# Patient Record
Sex: Male | Born: 1950 | Hispanic: No | Marital: Single | Smoking: Never smoker
Health system: Southern US, Community
[De-identification: ages and names within clinical notes are randomized; demographics above are authoritative.]

## PROBLEM LIST (undated history)

## (undated) DIAGNOSIS — M25562 Pain in left knee: Secondary | ICD-10-CM

## (undated) DIAGNOSIS — S8990XA Unspecified injury of unspecified lower leg, initial encounter: Secondary | ICD-10-CM

## (undated) HISTORY — PX: BRAIN SURGERY: SHX531

---

## 2010-04-28 ENCOUNTER — Emergency Department (HOSPITAL_COMMUNITY): Admission: EM | Admit: 2010-04-28 | Discharge: 2010-04-28 | Payer: Self-pay | Admitting: Emergency Medicine

## 2012-06-30 ENCOUNTER — Emergency Department (HOSPITAL_COMMUNITY): Payer: Self-pay

## 2012-06-30 ENCOUNTER — Emergency Department (HOSPITAL_COMMUNITY)
Admission: EM | Admit: 2012-06-30 | Discharge: 2012-07-01 | Disposition: A | Discharge status: Home / Self Care | Payer: Self-pay | Attending: Emergency Medicine | Admitting: Emergency Medicine

## 2012-06-30 ENCOUNTER — Encounter (HOSPITAL_COMMUNITY): Payer: Self-pay | Admitting: Emergency Medicine

## 2012-06-30 DIAGNOSIS — G9389 Other specified disorders of brain: Secondary | ICD-10-CM | POA: Insufficient documentation

## 2012-06-30 DIAGNOSIS — R1013 Epigastric pain: Secondary | ICD-10-CM | POA: Insufficient documentation

## 2012-06-30 DIAGNOSIS — R109 Unspecified abdominal pain: Secondary | ICD-10-CM

## 2012-06-30 DIAGNOSIS — R42 Dizziness and giddiness: Secondary | ICD-10-CM | POA: Insufficient documentation

## 2012-06-30 DIAGNOSIS — R110 Nausea: Secondary | ICD-10-CM | POA: Insufficient documentation

## 2012-06-30 LAB — CBC WITH DIFFERENTIAL/PLATELET
Basophils Absolute: 0 10*3/uL (ref 0.0–0.1)
Basophils Relative: 1 % (ref 0–1)
Eosinophils Absolute: 0.2 10*3/uL (ref 0.0–0.7)
Eosinophils Relative: 3 % (ref 0–5)
HCT: 41.1 % (ref 39.0–52.0)
Lymphocytes Relative: 33 % (ref 12–46)
MCHC: 33.8 g/dL (ref 30.0–36.0)
MCV: 89 fL (ref 78.0–100.0)
Monocytes Absolute: 0.5 10*3/uL (ref 0.1–1.0)
Platelets: 236 10*3/uL (ref 150–400)
RDW: 13 % (ref 11.5–15.5)
WBC: 5.3 10*3/uL (ref 4.0–10.5)

## 2012-06-30 LAB — COMPREHENSIVE METABOLIC PANEL
ALT: 26 U/L (ref 0–53)
AST: 32 U/L (ref 0–37)
Albumin: 4 g/dL (ref 3.5–5.2)
CO2: 25 mEq/L (ref 19–32)
Calcium: 9.4 mg/dL (ref 8.4–10.5)
Creatinine, Ser: 0.86 mg/dL (ref 0.50–1.35)
GFR calc non Af Amer: >90 mL/min (ref >90)
Sodium: 137 mEq/L (ref 135–145)
Total Protein: 7.5 g/dL (ref 6.0–8.3)

## 2012-06-30 NOTE — ED Notes (Signed)
Patient transported to MRI 

## 2012-06-30 NOTE — ED Notes (Addendum)
Per interpreter phone: Pt complains of headache, stomachache, and nausea x 1 day.  Reports pain as 8/10 Stomach is non tender on palpation in all four quadrants. Pt. Denies any GI history. Denies changes to bowel and bladder.  Pt. A.O. X 4 . Vitals stable. Family at bedside.

## 2012-06-30 NOTE — ED Provider Notes (Signed)
History     CSN: [id number]  Arrival date & time 06/30/12  2005   First MD Initiated Contact with Patient 06/30/12 2051      Chief Complaint  Patient presents with  . Abdominal Pain  . Dizziness    (Consider location/radiation/quality/duration/timing/severity/associated sxs/prior treatment) Patient is a 61 y.o. male presenting with neurologic complaint. The history is provided by the patient. The history is limited by a language barrier.  Neurologic Problem The primary symptoms include headaches, dizziness and nausea. Primary symptoms do not include loss of consciousness, altered mental status, seizures, visual change, paresthesias, focal weakness, loss of sensation, speech change, fever or vomiting. The symptoms began yesterday. The symptoms are waxing and waning.  The headache began yesterday. The headache developed gradually. Headache is a recurrent problem. The headache is present intermittently. Location/region(s) of the headache: frontal. The headache is not associated with photophobia, eye pain, visual change, neck stiffness, paresthesias, weakness or loss of balance.  He describes the dizziness as a sensation of spinning. The dizziness began yesterday. The dizziness has been resolved since its onset. It is a new problem. Dizziness also occurs with nausea. Dizziness does not occur with tinnitus, vomiting or weakness.  Nausea began yesterday. The nausea is exacerbated by motion.  Additional symptoms include vertigo. Additional symptoms do not include neck stiffness, weakness, loss of balance, photophobia, nystagmus, hearing loss or tinnitus. Medical issues do not include cerebral vascular accident. Associated medical issues comments: History of head trauma and craniotomy 10 years ago.Marland Kitchen    History reviewed. No pertinent past medical history.  Past Surgical History  Procedure Date  . Brain surgery     History reviewed. No pertinent family history.  History  Substance Use Topics    . Smoking status: Never Smoker   . Smokeless tobacco: Not on file  . Alcohol Use: No      Review of Systems  Constitutional: Negative for fever.  HENT: Negative for hearing loss, neck stiffness and tinnitus.   Eyes: Negative for photophobia, pain and visual disturbance.  Respiratory: Negative.   Cardiovascular: Negative.   Gastrointestinal: Positive for nausea and abdominal pain (epigastric). Negative for vomiting, diarrhea and blood in stool.  Genitourinary: Negative.   Musculoskeletal: Negative.   Skin: Negative.   Neurological: Positive for dizziness, vertigo and headaches. Negative for tremors, speech change, focal weakness, seizures, loss of consciousness, syncope, facial asymmetry, speech difficulty, weakness, numbness, paresthesias and loss of balance.  Psychiatric/Behavioral: Negative for altered mental status.  All other systems reviewed and are negative.    Allergies  Review of patient's allergies indicates no known allergies.  Home Medications   Current Outpatient Rx  Name Route Sig Dispense Refill  . IBUPROFEN 200 MG PO TABS Oral Take 200 mg by mouth every 6 (six) hours as needed. For pain.      BP 107/63  Pulse 54  Temp 97.6 F (36.4 C) (Oral)  Resp 16  SpO2 100%  Physical Exam  Nursing note and vitals reviewed. Constitutional: He is oriented to person, place, and time. He appears well-developed and well-nourished. No distress.  HENT:  Head: Normocephalic and atraumatic.  Right Ear: Tympanic membrane and ear canal normal.  Left Ear: Tympanic membrane and ear canal normal.  Nose: Nose normal.  Mouth/Throat: Oropharynx is clear and moist and mucous membranes are normal. No oropharyngeal exudate.  Eyes: Conjunctivae and EOM are normal. Pupils are equal, round, and reactive to light.  Fundoscopic exam:      The right eye shows no  papilledema.       The left eye shows no papilledema.  Neck: Neck supple. No spinous process tenderness and no muscular  tenderness present.  Cardiovascular: Normal rate, regular rhythm, normal heart sounds and intact distal pulses.   Pulmonary/Chest: Effort normal and breath sounds normal. He has no wheezes. He has no rales.  Abdominal: Soft. He exhibits no distension. There is no tenderness.  Musculoskeletal: Normal range of motion.  Neurological: He is alert and oriented to person, place, and time. He has normal strength. No cranial nerve deficit or sensory deficit. Coordination and gait normal. GCS eye subscore is 4. GCS verbal subscore is 5. GCS motor subscore is 6.  Skin: Skin is warm and dry.    ED Course  Procedures (including critical care time)  Labs Reviewed  COMPREHENSIVE METABOLIC PANEL - Abnormal; Notable for the following:    Glucose, Bld 101 (*)     All other components within normal limits  URINALYSIS, ROUTINE W REFLEX MICROSCOPIC - Abnormal; Notable for the following:    APPearance CLOUDY (*)     All other components within normal limits  CBC WITH DIFFERENTIAL  LIPASE, BLOOD   Dg Skull 1-3 Views  06/30/2012  *RADIOLOGY REPORT*  Clinical Data: Dizziness.  Pre MRI evaluation.  Unknown surgical history.  SKULL - 1-3 VIEW  Comparison: None  Findings: AP and lateral views are performed of the skull, showing patient with had prior craniotomy.  Multiple small metallic fragments project over the right aspect of the skull, likely representing surgical sutures/metal related to craniotomy.  No definite coils or clips overlying the central aspect of the brain.  IMPRESSION: Retained metal fragments, favored to be peripherally located on the right.  MRI will be deferred in favor of CT exam of the brain.  Original Report Authenticated By: Patterson Hammersmith, M.D.   Ct Head Wo Contrast  07/01/2012  *RADIOLOGY REPORT*  Clinical Data: Abdominal pain and dizziness.  Bifrontal headache with vertigo, nausea.  History of craniotomy.  Skull views show a small metallic fragments, primarily overlying the right lateral  skull.  CT HEAD WITHOUT CONTRAST  Technique:  Contiguous axial images were obtained from the base of the skull through the vertex without contrast.  Comparison: Skull films 06/30/2012  Findings: There is extensive encephalomalacia and hydrocephalus ex vacuo in the right middle cerebral artery distribution, consistent with large, chronic infarct.  There is no evidence for intracranial hemorrhage.  No intra or extra-axial mass identified.  No evidence for acute infarction.  There is cerebral and cerebellar atrophy. No evidence for aneurysm clips or coils.  Bone windows show craniotomy changes in the right frontal and temporal regions.  No evidence for acute calvarial fracture. Small metallic densities are identified adjacent to the skull, consistent with prior surgical change/craniotomy.  IMPRESSION:  1.  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 2. No evidence for acute intracranial abnormality. 3.  Postoperative changes account for the metallic densities seen on plain film.  No contraindication to MRI.  Original Report Authenticated By: Patterson Hammersmith, M.D.     1. Vertigo 2. Headache    MDM  61 yo male with PMHx of head trauma and craniotomy 10 years ago presents with complaint of intermittent vertigo, headache, and nausea that started yesterday.  Sx are waxing and waning.  Pt sx free in the ED.  No focal weakness, numbness, or paresthesias.  No history of fever.  AF, VSS,  NAD.  Normal neuro exam including sensation, strength, and cerebellar testing.  Mild epigastric tenderness on exam.  CBC and CMP wnl.  Will add lipase and get MRI for further evaluation.  Unable to obtain MRI without further evaluation of surgical clips.  Will get plain films of the skull and CT head w/o contrast.  Lipase wnl.  UA negative for UTI.  CT showed extensive old right MCA infarct with hydrocephalus ex vacuo and  encephalomalacia.  No acute intracranial abnormalities.  No contraindication to MRI.  Will hold in CDU overnight for MRI in the morning.      Cherre Robins, MD 07/01/12 [phone]

## 2012-06-30 NOTE — ED Notes (Signed)
Pt unable to urinate at this time. States will try later 

## 2012-06-30 NOTE — ED Notes (Signed)
Pt speaks spanish, family at bedside translating; pt having dizziness, headache, mid abd pain; with nausea and vomiting, had similar symptoms 2 weeks ago, but has resolved, until they started again last night

## 2012-07-01 ENCOUNTER — Emergency Department (HOSPITAL_COMMUNITY): Payer: Self-pay

## 2012-07-01 ENCOUNTER — Encounter (HOSPITAL_COMMUNITY): Payer: Self-pay | Admitting: Radiology

## 2012-07-01 LAB — URINALYSIS, ROUTINE W REFLEX MICROSCOPIC
Glucose, UA: NEGATIVE mg/dL
Hgb urine dipstick: NEGATIVE
Leukocytes, UA: NEGATIVE
Protein, ur: NEGATIVE mg/dL
Specific Gravity, Urine: 1.014 (ref 1.005–1.030)

## 2012-07-01 NOTE — ED Provider Notes (Signed)
61 year old male had onset yesterday of a bifrontal headache with associated vertigo and nausea. There's been no fever or chills. There's been no nausea or vomiting. His exam is unremarkable. There is no nystagmus and dizziness is not reproduced by vestibular stimulation. Neurologic exam is normal including normal including normal cerebellar exam. MRI was requested but could not be done safely because he apparently has a metallic plate in his head. CT has been ordered.  Dione Booze, MD 07/01/12 [phone]

## 2012-07-01 NOTE — ED Provider Notes (Signed)
61 y/o male in CDU awaiting MRI. Family in room translating for patient. Denies any new s/s. Resting comfortably and in NAD. Heart RRR. Lungs CTA with normal respiratory effort. No extremity edema. Distal pulses intact. No motor or sensory deficits present. Patient is AAOx3.   10:54 AM MRI showing no acute findings. Patient states he is feeling better. Denies any dizziness at this time. Neuro exam with no focal neuro deficits. CN II-XII intact. No motor or sensory deficits present. PERRLA. EOMI. Normal gait. AAOx3. Case discussed with Dr. Lorenso Courier who agrees patient may be discharged at this time ith neuro follow up.  Trevor Mace, PA-C 07/01/12 1057

## 2012-07-01 NOTE — ED Notes (Signed)
Pt. Updated on plan of care: aware that he will need to stay until later this morning to have an MRI. Will be roomed to CDU when possible.  

## 2012-07-01 NOTE — ED Provider Notes (Signed)
I saw and evaluated the patient, reviewed the resident's note and I agree with the findings and plan.   Darriel Utter, MD 07/01/12 2255 

## 2012-07-01 NOTE — ED Notes (Addendum)
Pt. Updated on plan of care: aware that he will need to stay until later this morning to have an MRI. Will be roomed to CDU when possible.

## 2012-07-01 NOTE — ED Provider Notes (Signed)
Patient had no new complaints overnight.  He will have MRI this AM, and be reassessed.  Case signed out to Dr. Lorenso Courier, and PA Zella Ball.  Gerhard Munch, MD 07/01/12 [phone]

## 2012-07-01 NOTE — ED Notes (Signed)
MD at bedside. 

## 2012-07-01 NOTE — ED Notes (Signed)
Family at bedside. 

## 2012-07-01 NOTE — ED Notes (Signed)
Unable to move Pt to CDU at this time due to nurse: Pt ratio. EDP aware.

## 2012-07-01 NOTE — ED Provider Notes (Signed)
History     CSN: [id number]  Arrival date & time 06/30/12  2005   First MD Initiated Contact with Patient 06/30/12 2051      Chief Complaint  Patient presents with  . Abdominal Pain  . Dizziness    (Consider location/radiation/quality/duration/timing/severity/associated sxs/prior treatment) HPI  History reviewed. No pertinent past medical history.  Past Surgical History  Procedure Date  . Brain surgery     History reviewed. No pertinent family history.  History  Substance Use Topics  . Smoking status: Never Smoker   . Smokeless tobacco: Not on file  . Alcohol Use: No      Review of Systems  Allergies  Review of patient's allergies indicates no known allergies.  Home Medications   Current Outpatient Rx  Name Route Sig Dispense Refill  . IBUPROFEN 200 MG PO TABS Oral Take 200 mg by mouth every 6 (six) hours as needed. For pain.      BP 128/67  Pulse 50  Temp 97.9 F (36.6 C) (Oral)  Resp 16  SpO2 99%  Physical Exam  ED Course  Procedures (including critical care time)  Labs Reviewed  COMPREHENSIVE METABOLIC PANEL - Abnormal; Notable for the following:    Glucose, Bld 101 (*)     All other components within normal limits  URINALYSIS, ROUTINE W REFLEX MICROSCOPIC - Abnormal; Notable for the following:    APPearance CLOUDY (*)     All other components within normal limits  CBC WITH DIFFERENTIAL  LIPASE, BLOOD   Dg Skull 1-3 Views  06/30/2012  *RADIOLOGY REPORT*  Clinical Data: Dizziness.  Pre MRI evaluation.  Unknown surgical history.  SKULL - 1-3 VIEW  Comparison: None  Findings: AP and lateral views are performed of the skull, showing patient with had prior craniotomy.  Multiple small metallic fragments project over the right aspect of the skull, likely representing surgical sutures/metal related to craniotomy.  No definite coils or clips overlying the central aspect of the brain.  IMPRESSION: Retained metal fragments, favored to be peripherally  located on the right.  MRI will be deferred in favor of CT exam of the brain.  Original Report Authenticated By: Patterson Hammersmith, M.D.   Ct Head Wo Contrast  07/01/2012  *RADIOLOGY REPORT*  Clinical Data: Abdominal pain and dizziness.  Bifrontal headache with vertigo, nausea.  History of craniotomy.  Skull views show a small metallic fragments, primarily overlying the right lateral skull.  CT HEAD WITHOUT CONTRAST  Technique:  Contiguous axial images were obtained from the base of the skull through the vertex without contrast.  Comparison: Skull films 06/30/2012  Findings: There is extensive encephalomalacia and hydrocephalus ex vacuo in the right middle cerebral artery distribution, consistent with large, chronic infarct.  There is no evidence for intracranial hemorrhage.  No intra or extra-axial mass identified.  No evidence for acute infarction.  There is cerebral and cerebellar atrophy. No evidence for aneurysm clips or coils.  Bone windows show craniotomy changes in the right frontal and temporal regions.  No evidence for acute calvarial fracture. Small metallic densities are identified adjacent to the skull, consistent with prior surgical change/craniotomy.  IMPRESSION:  1.  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 2. No evidence for acute intracranial abnormality. 3.  Postoperative changes account for the metallic densities seen on plain film.  No contraindication to MRI.  Original Report Authenticated By: Patterson Hammersmith, M.D.   Mr Brain Wo Contrast  07/01/2012  *RADIOLOGY REPORT*  Clinical Data: Frontal headache and new onset of dizziness yesterday.  MRI HEAD WITHOUT CONTRAST  Technique:  Multiplanar, multiecho pulse sequences of the brain and surrounding structures were obtained according to standard protocol without intravenous contrast.  Comparison: CT head without contrast 07/01/2012.  Findings:  Extensive medial encephalomalacia is present within the right frontal and occipital lobe.  There is asymmetric atrophy of the entire hemisphere with ex vacuo dilation of the right lateral ventricle.  The patient is status post right frontal craniotomy. There is some metallic artifact secondary to metal external to the craniotomy flap.  No acute hemorrhage is present.  There is some susceptibility in the left cerebellum which may be related to remote blood products.  A tiny lacunar infarct is present in this area.  No acute infarct, hemorrhage, or mass lesion is present.  The left cerebral hemisphere is relatively normal for age.  Flow is present in the major intracranial arteries.  The globes and orbits are intact.  Mild mucosal thickening is present in the maxillary sinuses bilaterally.  A small polyp or mucous retention cyst is evident in the posterior left maxillary sinus.  The mastoid air cells are clear.  IMPRESSION:  1.  Chronic encephalomalacia along the medial aspect the right cerebral hemisphere involving both the ACA and PCA territories. 2.  Asymmetric atrophy of the right hemisphere with associated ex vacuo dilation. 3.  Status post right craniotomy. 4.  Mild mucosal disease as described.  No evidence for acute sinusitis. 3.  Remote lacunar infarct of the left cerebellum with minimal susceptibility, suggesting remote petechial hemorrhage.  No acute hemorrhages present.  Original Report Authenticated By: Jamesetta Orleans. MATTERN, M.D.     No diagnosis found.    MDM  Received sign-over at 0730, pt was awaiting an MRI of brain.  The MRI demonstrates no acute evidence of infarction.  Plan repeat neuro exam, if no new neurologic deficits are observed, will discharge home.        Tobin Chad, MD 07/01/12 1049

## 2012-07-01 NOTE — ED Notes (Signed)
Pt. Awake and updated on plan of care. Aware that he will be going for MRI during the next shift. A.O. X 4. NAD.

## 2012-07-01 NOTE — ED Notes (Signed)
Pt. Returned from CT.

## 2013-03-28 ENCOUNTER — Emergency Department (HOSPITAL_COMMUNITY)
Admission: EM | Admit: 2013-03-28 | Discharge: 2013-03-28 | Disposition: A | Discharge status: Home / Self Care | Payer: Self-pay | Attending: Emergency Medicine | Admitting: Emergency Medicine

## 2013-03-28 ENCOUNTER — Encounter (HOSPITAL_COMMUNITY): Payer: Self-pay | Admitting: Emergency Medicine

## 2013-03-28 ENCOUNTER — Emergency Department (HOSPITAL_COMMUNITY): Payer: Self-pay

## 2013-03-28 DIAGNOSIS — G8929 Other chronic pain: Secondary | ICD-10-CM | POA: Insufficient documentation

## 2013-03-28 DIAGNOSIS — M25561 Pain in right knee: Secondary | ICD-10-CM

## 2013-03-28 DIAGNOSIS — M25569 Pain in unspecified knee: Secondary | ICD-10-CM | POA: Insufficient documentation

## 2013-03-28 HISTORY — DX: Pain in left knee: M25.562

## 2013-03-28 MED ORDER — HYDROCODONE-ACETAMINOPHEN 5-325 MG PO TABS
1.0000 | ORAL_TABLET | Freq: Once | ORAL | Status: AC
  Administered 2013-03-28: 1 via ORAL
  Filled 2013-03-28: qty 1

## 2013-03-28 MED ORDER — HYDROCODONE-ACETAMINOPHEN 5-325 MG PO TABS: 1.0000 | ORAL_TABLET | Freq: Once | ORAL | Status: DC

## 2013-03-28 NOTE — ED Notes (Signed)
Pt in the bathroom at this time.  

## 2013-03-28 NOTE — ED Notes (Signed)
Pt has returned from the bathroom

## 2013-03-28 NOTE — ED Notes (Signed)
Pt c/o left knee pain x 8 years that is worse over last couple of days; pt noted to have limp per norm per family; pt speaks lilttle english

## 2013-03-30 NOTE — ED Provider Notes (Signed)
History     CSN: [id number]  Arrival date & time 03/28/13  1428   First MD Initiated Contact with Patient 03/28/13 1727      Chief Complaint  Patient presents with  . Knee Pain    (Consider location/radiation/quality/duration/timing/severity/associated sxs/prior treatment) Patient is a 61 y.o. male presenting with knee pain. The history is provided by the patient. A language interpreter was used.  Knee Pain Location:  Knee Knee location:  L knee Pain details:    Quality:  Aching Associated symptoms: no fever   Associated symptoms comment:  He has had chronic pain for many years and is requesting a surgery referral. His son, who is giving history, states that he goes to a PCP but that the problem is not being solved.   Past Medical History  Diagnosis Date  . Left knee pain     chronic    Past Surgical History  Procedure Laterality Date  . Brain surgery      History reviewed. No pertinent family history.  History  Substance Use Topics  . Smoking status: Never Smoker   . Smokeless tobacco: Not on file  . Alcohol Use: No      Review of Systems  Constitutional: Negative for fever and chills.  HENT: Negative.   Respiratory: Negative.   Cardiovascular: Negative.   Gastrointestinal: Negative.   Musculoskeletal: Negative.        See HPI  Skin: Negative.   Neurological: Negative.     Allergies  Review of patient's allergies indicates no known allergies.  Home Medications   Current Outpatient Rx  Name  Route  Sig  Dispense  Refill  . ibuprofen (ADVIL,MOTRIN) 200 MG tablet   Oral   Take 200 mg by mouth every 6 (six) hours as needed. For pain.         Marland Kitchen HYDROcodone-acetaminophen (NORCO/VICODIN) 5-325 MG per tablet   Oral   Take 1 tablet by mouth once.   15 tablet   0     BP 113/72  Pulse 69  Temp(Src) 98.4 F (36.9 C) (Oral)  Resp 16  SpO2 96%  Physical Exam  Constitutional: He is oriented to person, place, and time. He appears well-developed  and well-nourished. No distress.  Cardiovascular: Intact distal pulses.   Pulmonary/Chest: Effort normal.  Musculoskeletal:  Right knee nontender to palpation. He appears uncomfortable when walking but can mbulate without assistance. Joint appears stable without laxity. No swelling or redness.   Neurological: He is alert and oriented to person, place, and time. He has normal reflexes.    ED Course  Procedures (including critical care time)  Labs Reviewed - No data to display Dg Femur Left  03/28/2013  *RADIOLOGY REPORT*  Clinical Data: Knee pain  LEFT FEMUR - 2 VIEW  Comparison: None.  Findings: Five views of the left femur submitted.  No acute fracture or subluxation.  Mild narrowing of medial joint compartment.  Narrowing of patellofemoral joint space.  Small knee joint effusion.  IMPRESSION: No acute fracture or subluxation.  Degenerative changes knee joint. Small joint effusion.   Original Report Authenticated By: Natasha Mead, M.D.      1. Knee pain, right       MDM  Through interpreter, patient told we would make orthopedic referral, given something for pain and encourage follow up. Patient's son reports all concerns of patient and family have been addressed.        Arnoldo Hooker, PA-C 03/30/13 1346

## 2013-03-31 NOTE — ED Provider Notes (Signed)
  Medical screening examination/treatment/procedure(s) were performed by non-physician practitioner and as supervising physician I was immediately available for consultation/collaboration.    Vida Roller, MD 03/31/13 0800

## 2013-05-17 ENCOUNTER — Encounter (HOSPITAL_COMMUNITY): Payer: Self-pay | Admitting: *Deleted

## 2013-05-17 ENCOUNTER — Emergency Department (HOSPITAL_COMMUNITY)
Admission: EM | Admit: 2013-05-17 | Discharge: 2013-05-17 | Disposition: A | Discharge status: Home / Self Care | Payer: PRIVATE HEALTH INSURANCE | Source: Home / Self Care | Attending: Family Medicine | Admitting: Family Medicine

## 2013-05-17 DIAGNOSIS — M25562 Pain in left knee: Secondary | ICD-10-CM

## 2013-05-17 DIAGNOSIS — M25569 Pain in unspecified knee: Secondary | ICD-10-CM

## 2013-05-17 DIAGNOSIS — G8929 Other chronic pain: Secondary | ICD-10-CM

## 2013-05-17 MED ORDER — CAPSAICIN IN LIDOCAINE VEHICLE 0.25 % EX CREA: 1.0000 "application " | TOPICAL_CREAM | Freq: Two times a day (BID) | CUTANEOUS | Status: DC | PRN

## 2013-05-17 MED ORDER — ACETAMINOPHEN 650 MG PO TABS: 1.0000 | ORAL_TABLET | Freq: Three times a day (TID) | ORAL | Status: DC | PRN

## 2013-05-17 MED ORDER — TRAMADOL HCL 50 MG PO TABS: 50.0000 mg | ORAL_TABLET | Freq: Three times a day (TID) | ORAL | Status: DC | PRN

## 2013-05-17 MED ORDER — NAPROXEN 500 MG PO TABS: 500.0000 mg | ORAL_TABLET | Freq: Two times a day (BID) | ORAL | Status: DC

## 2013-05-17 NOTE — ED Provider Notes (Signed)
History     CSN: [id number]  Arrival date & time 05/17/13  1108   First MD Initiated Contact with Patient 05/17/13 1143      Chief Complaint  Patient presents with  . Knee Pain    (Consider location/radiation/quality/duration/timing/severity/associated sxs/prior treatment) HPI Comments: 62 year old male with history of chronic left knee pain. 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 Although denies history of low extremity fractures. Here c/o pain exacerbation in his left knee. No recent or new injuries or falls. He works picking up aluminum from the streets and walks for prolonged periods during the day. Patient has been seen at [facility] clinic for this complain was given a knee brace which he is using. Currently not taking any medications for pain.   Past Medical History  Diagnosis Date  . Left knee pain     chronic    Past Surgical History  Procedure Laterality Date  . Brain surgery      History reviewed. No pertinent family history.  History  Substance Use Topics  . Smoking status: Never Smoker   . Smokeless tobacco: Not on file  . Alcohol Use: No      Review of Systems  Constitutional: Negative for fever, chills and appetite change.  Musculoskeletal:       Left knee pain as per HPI  All other systems reviewed and are negative.    Allergies  Review of patient's allergies indicates no known allergies.  Home Medications   Current Outpatient Rx  Name  Route  Sig  Dispense  Refill  . Acetaminophen 650 MG TABS   Oral   Take 1 tablet (650 mg total) by mouth 3 (three) times daily as needed.   30 tablet   0   . Capsaicin in Lidocaine Vehicle 0.25 % CREA   Apply externally   Apply 1 application topically 2 (two) times daily as needed.   1 Tube   0   . naproxen (NAPROSYN) 500 MG tablet   Oral   Take 1 tablet (500 mg total) by  mouth 2 (two) times daily with a meal.   30 tablet   0   . traMADol (ULTRAM) 50 MG tablet   Oral   Take 1 tablet (50 mg total) by mouth every 8 (eight) hours as needed for pain.   20 tablet   0     BP 108/67  Pulse 58  Temp(Src) 98.4 F (36.9 C) (Oral)  Resp 20  SpO2 100%  Physical Exam  Nursing note and vitals reviewed. Constitutional: He is oriented to person, place, and time. He appears well-developed and well-nourished. No distress.  Cardiovascular: Normal heart sounds.   Pulmonary/Chest: Breath sounds normal.  Musculoskeletal:  Left knee: genu varus bilaterally. No obvious swelling or deformity. No redness or increased temperature. No palpable effusion. Reported pain with palpation below and medial to the patella. No hyper laxity on stress valgo or varus. Crepitus fell with flexion and extension and also patient reported pain with active and passive movement. Negative drawer test. Do not feel Baker's cyst. leftt lower extremity with normal superficial sensation Normal dorsal pedal and tibial posterior pulses. Patient is weightbearing but report pain with walking and has a limp on the left side.  Neurological: He is alert and oriented to person, place, and time.  Skin: He is not diaphoretic.    ED Course  Procedures (  including critical care time)  Labs Reviewed - No data to display No results found.   1. Chronic knee pain, left       MDM   Treated with acetaminophen, naproxen, tramadol and capsaicin. Recommended to followup with his primary care provider to monitor his symptoms and for possible orthopedic referral. Supportive care including knee sleeve and rehabilitation exercises and red flags that should prompt his return to medical attention discussed with patient and provided in writing.       Sharin Grave, MD 05/18/13 [phone]

## 2013-05-17 NOTE — ED Notes (Signed)
Pt  Reports  l  Knee  Pain         X  7  Days           denys  Any  Recent  specefic  Injury          He  Was  Seen  Er  Last  Month  For  Similar  Symptoms  -  He  Also  Reports  Pain and  Stiffness  In  His  Fingers  Of  r  Hand

## 2016-08-27 ENCOUNTER — Emergency Department (HOSPITAL_COMMUNITY): Payer: Self-pay

## 2016-08-27 ENCOUNTER — Encounter (HOSPITAL_COMMUNITY): Payer: Self-pay | Admitting: Emergency Medicine

## 2016-08-27 ENCOUNTER — Emergency Department (HOSPITAL_COMMUNITY)
Admission: EM | Admit: 2016-08-27 | Discharge: 2016-08-27 | Disposition: A | Discharge status: Home / Self Care | Payer: Self-pay | Attending: Emergency Medicine | Admitting: Emergency Medicine

## 2016-08-27 ENCOUNTER — Emergency Department (HOSPITAL_BASED_OUTPATIENT_CLINIC_OR_DEPARTMENT_OTHER): Admit: 2016-08-27 | Discharge: 2016-08-27 | Disposition: A | Discharge status: Home / Self Care | Payer: Self-pay

## 2016-08-27 DIAGNOSIS — Y999 Unspecified external cause status: Secondary | ICD-10-CM | POA: Insufficient documentation

## 2016-08-27 DIAGNOSIS — M79609 Pain in unspecified limb: Secondary | ICD-10-CM

## 2016-08-27 DIAGNOSIS — M79605 Pain in left leg: Secondary | ICD-10-CM | POA: Insufficient documentation

## 2016-08-27 DIAGNOSIS — W19XXXA Unspecified fall, initial encounter: Secondary | ICD-10-CM

## 2016-08-27 DIAGNOSIS — M62838 Other muscle spasm: Secondary | ICD-10-CM

## 2016-08-27 DIAGNOSIS — W1839XA Other fall on same level, initial encounter: Secondary | ICD-10-CM | POA: Insufficient documentation

## 2016-08-27 DIAGNOSIS — Y929 Unspecified place or not applicable: Secondary | ICD-10-CM | POA: Insufficient documentation

## 2016-08-27 DIAGNOSIS — Y939 Activity, unspecified: Secondary | ICD-10-CM | POA: Insufficient documentation

## 2016-08-27 HISTORY — DX: Unspecified injury of unspecified lower leg, initial encounter: S89.90XA

## 2016-08-27 LAB — BASIC METABOLIC PANEL
ANION GAP: 9 (ref 5–15)
BUN: 8 mg/dL (ref 6–20)
CO2: 29 mmol/L (ref 22–32)
Calcium: 9.1 mg/dL (ref 8.9–10.3)
Chloride: 100 mmol/L — ABNORMAL LOW (ref 101–111)
Creatinine, Ser: 0.77 mg/dL (ref 0.61–1.24)
GFR calc Af Amer: >60 mL/min (ref >60)
GLUCOSE: 93 mg/dL (ref 65–99)
POTASSIUM: 3.6 mmol/L (ref 3.5–5.1)
Sodium: 138 mmol/L (ref 135–145)

## 2016-08-27 LAB — CK: CK TOTAL: 231 U/L (ref 49–397)

## 2016-08-27 LAB — MAGNESIUM: MAGNESIUM: 2.3 mg/dL (ref 1.7–2.4)

## 2016-08-27 LAB — PHOSPHORUS: Phosphorus: 3.8 mg/dL (ref 2.5–4.6)

## 2016-08-27 MED ORDER — OXYCODONE-ACETAMINOPHEN 5-325 MG PO TABS
1.0000 | ORAL_TABLET | ORAL | Status: DC | PRN
  Administered 2016-08-27: 1 via ORAL
  Filled 2016-08-27: qty 1

## 2016-08-27 MED ORDER — CYCLOBENZAPRINE HCL 5 MG PO TABS: 5.0000 mg | ORAL_TABLET | Freq: Three times a day (TID) | ORAL | 0 refills | Status: DC | PRN

## 2016-08-27 MED ORDER — CYCLOBENZAPRINE HCL 10 MG PO TABS
10.0000 mg | ORAL_TABLET | Freq: Once | ORAL | Status: AC
  Administered 2016-08-27: 10 mg via ORAL
  Filled 2016-08-27: qty 1

## 2016-08-27 NOTE — ED Provider Notes (Signed)
[facility]-EMERGENCY DEPT Provider Note  CSN: [id number] Arrival Date & Time: 08/27/16 @ 1122  History    Chief Complaint Chief Complaint  Patient presents with  . Leg Pain    HPI Miguel Rhodes is a 65 y.o. male. Patient presents emergency department for assessment of left lower extremity pain that is localized to the left knee and left hip. Patient also endorses he has had chronic swelling from previous injury to that extremity approximately 3 years ago when he had work related fall that resulted in other significant trauma. Patient states that he fell 8 days ago did not have obvious wound or cut or laceration and denies fevers and chills at this time. Also denies he has had redness or swelling localized to knee joint however endorses that walking has become much more difficult for him and states that he is still able to walk with cane however there is significant amounts of pain.  Patient has no history of previous osteoporosis or gout or pseudogout. States he takes no other medications and has no other medical conditions other than those sustained from traumatic event. Patient denies chest pain or shortness of breath no palpitations.  Past Medical & Surgical History    Past Medical History:  Diagnosis Date  . Left knee pain    chronic  . Leg injury    There are no active problems to display for this patient.  Past Surgical History:  Procedure Laterality Date  . BRAIN SURGERY      Family & Social History    History reviewed. No pertinent family history. Social History  Substance Use Topics  . Smoking status: Never Smoker  . Smokeless tobacco: Never Used  . Alcohol use No    Home Medications    Prior to Admission medications   Medication Sig Start Date End Date Taking? Authorizing Provider  Acetaminophen 650 MG TABS Take 1 tablet (650 mg total) by mouth 3 (three) times daily as needed. 05/17/13   Adlih Moreno-Coll, MD  Capsaicin in Lidocaine Vehicle 0.25 % CREA  Apply 1 application topically 2 (two) times daily as needed. 05/17/13   Adlih Moreno-Coll, MD  naproxen (NAPROSYN) 500 MG tablet Take 1 tablet (500 mg total) by mouth 2 (two) times daily with a meal. 05/17/13   Adlih Moreno-Coll, MD  traMADol (ULTRAM) 50 MG tablet Take 1 tablet (50 mg total) by mouth every 8 (eight) hours as needed for pain. 05/17/13   Adlih Moreno-Coll, MD    Allergies    Review of patient's allergies indicates no known allergies.  I reviewed & agree with nursing's documentation on the patient's past medical, surgical, social & family histories as well as their allergies.  Review of Systems  Complete ROS obtained, and is negative except as stated in HPI.   Physical Exam  Updated Vital Signs BP 126/72 (BP Location: Right Arm)   Pulse 67   Resp 16   Wt 68 kg   SpO2 100%  I have reviewed the triage vital signs and the nursing notes. Physical Exam  Constitutional: He is oriented to person, place, and time. He appears well-developed and well-nourished.  Non-toxic appearance. He does not appear ill. No distress.  HENT:  Head: Normocephalic and atraumatic.  Right Ear: External ear normal.  Left Ear: External ear normal.  Mouth/Throat: Oropharynx is clear and moist.  Eyes: EOM are normal. Pupils are equal, round, and reactive to light. No scleral icterus.  Neck: Normal range of motion. Neck supple. No tracheal deviation  present.  Cardiovascular: Normal heart sounds and intact distal pulses.   No murmur heard. Pulmonary/Chest: Effort normal and breath sounds normal. No stridor. No respiratory distress. He has no wheezes. He has no rales.  Abdominal: Soft. Bowel sounds are normal. He exhibits no distension. There is no tenderness. There is no rebound and no guarding. No hernia (No femoral or inguinal hernias bilaterally.).  Musculoskeletal: Normal range of motion. He exhibits no deformity.  Neurological: He is alert and oriented to person, place, and time. He has normal  strength and normal reflexes. No cranial nerve deficit or sensory deficit.  Skin: Skin is warm and dry. Capillary refill takes less than 2 seconds. He is not diaphoretic.  Psychiatric: He has a normal mood and affect. His behavior is normal.  Nursing note and vitals reviewed.   ED Treatments & Results   Labs (only abnormal results are displayed) Labs Reviewed - No data to display  EKG    EKG Interpretation  Date/Time:    Ventricular Rate:    PR Interval:    QRS Duration:   QT Interval:    QTC Calculation:   R Axis:     Text Interpretation:         Radiology Dg Knee Complete 4 Views Left  Result Date: 08/27/2016 CLINICAL DATA:  Larey Seat 8 days ago with distal femur pain. EXAM: LEFT KNEE - COMPLETE 4+ VIEW COMPARISON:  Left femur 03/28/2013 and 08/27/2016 FINDINGS: Increased medial joint space narrowing compared to the previous examination. Mild depression of the medial tibial plateau appears to be chronic and may be associated with degenerative changes. Evidence for small suprapatellar joint effusion. Osteophytes in the lateral and patellofemoral compartments of the knee. 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 No acute bone abnormality. Electronically Signed   By: Richarda Overlie M.D.   On: 08/27/2016 12:53   Dg Femur Min 2 Views Left  Result Date: 08/27/2016 CLINICAL DATA:  Pain following fall 8 days prior EXAM: LEFT FEMUR 2 VIEWS COMPARISON:  None. FINDINGS: Frontal and lateral views were obtained. There is no fracture or dislocation. There is osteoarthritic change in the knee joint region. No abnormal periosteal reaction. IMPRESSION: Osteoarthritic change left knee region. No fracture or dislocation. No abnormal periosteal reaction. Electronically Signed   By: Bretta Bang III M.D.   On: 08/27/2016 12:51    Pertinent labs & imaging results that were available during my care of the patient were personally visualized by me and considered in my  medical decision making, please see chart for details.  Procedures (including critical care time) Procedures  Medications Ordered in ED Medications  oxyCODONE-acetaminophen (PERCOCET/ROXICET) 5-325 MG per tablet 1 tablet (1 tablet Oral Given 08/27/16 1147)    Initial Impression & Assessment and Plan & ED Course   Patient presents to the emergency department for assessment of acute on chronic leg pain in left lower extremity. Patient worse his original injury was approximately 3 years ago and states that it was reinjured 8 days ago and has had increasing pain since then. Patient is hemodynamically stable and has no vital sign abnormalities and is afebrile. Do not have concern for sepsis or septic arthritis and there is no draining ulcers and have considered osteomyelitis which are believed to be unlikely.  I obtained plain diagnostic imaging of the left upper extremity including hip and femur knee and pelvis along with obtaining metabolic screening labs CK and other electrolytes given patient's endorsement of localization to muscles of affected  extremity. I also obtained venous duplex ultrasound of left lower extremity given patient's chronic poor ambulatory status and repeated traumatic events. Patient has no shortness of breath or chest pain and given stable vital signs do not have concern for pulmonary embolism. Labs unremarkable. Imaging shows No acute fracture or dislocation of the pelvis, left knee or left femur. Also DVT US of lower extremities shows no evidence of deep vein or superficial thrombosis involving the right lower extremity and left lower extremity.  Given patient's significant muscle  fasciculations and endorsement of muscle pain are provided muscle relaxant which patient found near symptomatic relief w/ therefore wil provide short course and outpatient rehab referral.  Final Clinical Impressions(s) & ED Diagnoses   1. Fall     Final Disposition: ED Course in its entirety,  care plan & clinical impressions w/ associated risks were reviewed w/ the patient and relative(s). In light of the patient's reassuring evaluation above, I consider discharge disposition reasonable. They are in agreement. I gave my typical, strict return precautions in simple, non-technical language. We also discussed symptoms that are most concerning & would necessitate emergent return. I explicitly told them to immediately return to the ED if new symptoms develop, if worse, or for ANY concern. Treatments & follow up plan agreed upon & I confirmed all concerns & questions were addressed prior to discharge.  Follow Up [facility] EMERGENCY DEPARTMENT [address] [id number] mc [place] [address] [phone] Go to  FOR ANY CONCERNS OR IF [facility] [place] [address], [address] [place] [address] [phone] Schedule an appointment as soon as possible for a visit in 1 week for rehabilitation and physical therapy in leg  [facility] [address] [place] [address] [phone] Schedule an appointment as soon as possible for a visit in 1 day to establish Primary Care, For symptomatic reassessment   New Prescriptions Discharge Medication List as of 08/27/2016  6:53 PM    START taking these medications   Details  cyclobenzaprine (FLEXERIL) 5 MG tablet Take 1 tablet (5 mg total) by mouth 3 (three) times daily as needed for muscle spasms. Take dose & frequency as prescribed. Don't take with any alcohol. Don't with your previous anxiety, sleeping or pain medications without asking your physician if okay.  Can result in death, impair thinking/coordination/reflexes & cause drowsiness. Don't operate heavy machinery or drive while taking. Is addicting if taken more than needed., Starting Thu 08/27/2016, Print        Patient care discussed with the  attending physician, who oversaw their evaluation & treatment & voiced agreement. Note: This document was prepared using Dragon voice recognition software and may include unintentional dictation errors.  House Officer: Jonette Eva, MD, Emergency Medicine Resident.   Jonette Eva, MD 08/31/16 0000    Jacalyn Lefevre, MD 09/01/16 [phone]

## 2016-08-27 NOTE — Progress Notes (Signed)
**  Preliminary report by tech**  Left lower extremity venous duplex completed. Technically difficult scan due to patient immobility, and acoustic shadowing. There is no evidence of deep or superficial vein thrombosis involving the left lower extremity. All visualized vessels appear patent and compressible. There is no evidence of a Baker's cyst on the left. Results were given to the patient's nurse, Steward DroneBrenda.  08/27/16 5:26 PM Olen CordialGreg Lavere Stork RVT

## 2016-08-27 NOTE — ED Notes (Signed)
Vascular at bedside

## 2016-08-27 NOTE — ED Notes (Signed)
Pt brought in to room with interpreter video utilized ER MD to bedside pt appears uncomfortable from pain ER MD aware orders received

## 2016-08-27 NOTE — ED Triage Notes (Signed)
Patient has left thigh injury years ago. Here for evaluation of increased pain and falls. Last fall 8 days ago. Pain currently 9/10 achy sharp.

## 2018-01-21 ENCOUNTER — Encounter (HOSPITAL_COMMUNITY): Payer: Self-pay | Admitting: Emergency Medicine

## 2018-01-21 ENCOUNTER — Emergency Department (HOSPITAL_COMMUNITY)
Admission: EM | Admit: 2018-01-21 | Discharge: 2018-01-22 | Disposition: A | Discharge status: Home / Self Care | Payer: Self-pay | Attending: Emergency Medicine | Admitting: Emergency Medicine

## 2018-01-21 ENCOUNTER — Other Ambulatory Visit: Payer: Self-pay

## 2018-01-21 ENCOUNTER — Emergency Department (HOSPITAL_COMMUNITY): Payer: Self-pay

## 2018-01-21 DIAGNOSIS — Z79899 Other long term (current) drug therapy: Secondary | ICD-10-CM | POA: Insufficient documentation

## 2018-01-21 DIAGNOSIS — R51 Headache: Secondary | ICD-10-CM | POA: Insufficient documentation

## 2018-01-21 DIAGNOSIS — R0789 Other chest pain: Secondary | ICD-10-CM | POA: Insufficient documentation

## 2018-01-21 DIAGNOSIS — R519 Headache, unspecified: Secondary | ICD-10-CM

## 2018-01-21 DIAGNOSIS — R42 Dizziness and giddiness: Secondary | ICD-10-CM | POA: Insufficient documentation

## 2018-01-21 DIAGNOSIS — R079 Chest pain, unspecified: Secondary | ICD-10-CM

## 2018-01-21 DIAGNOSIS — J690 Pneumonitis due to inhalation of food and vomit: Secondary | ICD-10-CM | POA: Insufficient documentation

## 2018-01-21 DIAGNOSIS — R112 Nausea with vomiting, unspecified: Secondary | ICD-10-CM | POA: Insufficient documentation

## 2018-01-21 LAB — BASIC METABOLIC PANEL
ANION GAP: 12 (ref 5–15)
BUN: 17 mg/dL (ref 6–20)
CALCIUM: 8.8 mg/dL — AB (ref 8.9–10.3)
CO2: 26 mmol/L (ref 22–32)
Chloride: 105 mmol/L (ref 101–111)
Creatinine, Ser: 0.9 mg/dL (ref 0.61–1.24)
GFR calc non Af Amer: >60 mL/min (ref >60)
GLUCOSE: 115 mg/dL — AB (ref 65–99)
POTASSIUM: 4 mmol/L (ref 3.5–5.1)
Sodium: 143 mmol/L (ref 135–145)

## 2018-01-21 LAB — CBC
HEMATOCRIT: 42.9 % (ref 39.0–52.0)
HEMOGLOBIN: 14.6 g/dL (ref 13.0–17.0)
MCH: 31.1 pg (ref 26.0–34.0)
MCHC: 34 g/dL (ref 30.0–36.0)
MCV: 91.5 fL (ref 78.0–100.0)
Platelets: 299 10*3/uL (ref 150–400)
RBC: 4.69 MIL/uL (ref 4.22–5.81)
RDW: 13.5 % (ref 11.5–15.5)
WBC: 9.2 10*3/uL (ref 4.0–10.5)

## 2018-01-21 LAB — I-STAT TROPONIN, ED: TROPONIN I, POC: 0 ng/mL (ref 0.00–0.08)

## 2018-01-21 NOTE — ED Triage Notes (Signed)
Patient complaining of left chest pain, dizziness, vomiting, and a headache. Patient states these symptoms started yesterday.

## 2018-01-22 ENCOUNTER — Emergency Department (HOSPITAL_COMMUNITY): Payer: Self-pay

## 2018-01-22 ENCOUNTER — Encounter (HOSPITAL_COMMUNITY): Payer: Self-pay | Admitting: Radiology

## 2018-01-22 LAB — RAPID URINE DRUG SCREEN, HOSP PERFORMED
Amphetamines: NOT DETECTED
Barbiturates: NOT DETECTED
Benzodiazepines: NOT DETECTED
COCAINE: NOT DETECTED
OPIATES: NOT DETECTED
Tetrahydrocannabinol: NOT DETECTED

## 2018-01-22 LAB — I-STAT CHEM 8, ED
BUN: 15 mg/dL (ref 6–20)
CALCIUM ION: 1.05 mmol/L — AB (ref 1.15–1.40)
CHLORIDE: 105 mmol/L (ref 101–111)
CREATININE: 0.8 mg/dL (ref 0.61–1.24)
GLUCOSE: 107 mg/dL — AB (ref 65–99)
HCT: 44 % (ref 39.0–52.0)
Hemoglobin: 15 g/dL (ref 13.0–17.0)
Potassium: 3.8 mmol/L (ref 3.5–5.1)
Sodium: 143 mmol/L (ref 135–145)
TCO2: 29 mmol/L (ref 22–32)

## 2018-01-22 LAB — I-STAT TROPONIN, ED: TROPONIN I, POC: 0 ng/mL (ref 0.00–0.08)

## 2018-01-22 LAB — URINALYSIS, ROUTINE W REFLEX MICROSCOPIC
Bilirubin Urine: NEGATIVE
Glucose, UA: NEGATIVE mg/dL
Hgb urine dipstick: NEGATIVE
Ketones, ur: NEGATIVE mg/dL
Leukocytes, UA: NEGATIVE
Nitrite: NEGATIVE
PH: 7 (ref 5.0–8.0)
Protein, ur: NEGATIVE mg/dL
SPECIFIC GRAVITY, URINE: 1.015 (ref 1.005–1.030)

## 2018-01-22 LAB — ETHANOL: Alcohol, Ethyl (B): <10 mg/dL (ref <10)

## 2018-01-22 LAB — APTT: aPTT: 37 seconds — ABNORMAL HIGH (ref 24–36)

## 2018-01-22 LAB — PROTIME-INR
INR: 0.94
PROTHROMBIN TIME: 12.5 s (ref 11.4–15.2)

## 2018-01-22 MED ORDER — MECLIZINE HCL 25 MG PO TABS: 25.0000 mg | ORAL_TABLET | Freq: Three times a day (TID) | ORAL | 0 refills | Status: AC | PRN

## 2018-01-22 MED ORDER — MECLIZINE HCL 25 MG PO TABS
25.0000 mg | ORAL_TABLET | Freq: Once | ORAL | Status: AC
  Administered 2018-01-22: 25 mg via ORAL
  Filled 2018-01-22: qty 1

## 2018-01-22 MED ORDER — AMOXICILLIN-POT CLAVULANATE 875-125 MG PO TABS: 1.0000 | ORAL_TABLET | Freq: Two times a day (BID) | ORAL | 0 refills | Status: AC

## 2018-01-22 MED ORDER — ACETAMINOPHEN 500 MG PO TABS
1000.0000 mg | ORAL_TABLET | Freq: Once | ORAL | Status: AC
  Administered 2018-01-22: 1000 mg via ORAL
  Filled 2018-01-22: qty 2

## 2018-01-22 NOTE — ED Notes (Signed)
Pt aware urine sample is needed 

## 2018-01-22 NOTE — ED Provider Notes (Addendum)
Transferred from [facility] ED for MRI and MRA of the head.  [facility], please see her note for full details, but in brief patient presents to the ED for evaluation of headache dizziness and left-sided chest pain.  She has had intermittent episodes of left-sided chest pain.  In 2 days ago developed severe vertiginous symptoms, last night ataxia and dizziness became worse, and patient developed a sudden headache.  Normal neurologic exam in, but unable to assess gait.   Vitals stable thus far in ED course. Labs overall unremarkable, negative trop and EKG reassuring. CXR shows Opacities at the right lung base may indicate developing consolidation, possibly aspiration or pneumonia. No leukocytosis and vitals not indicative of sepsis. 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 MRI and MRA of head ordered and pt transferred for further workup.  Plan: Follow up on MRI results.  Mr Angiogram Head Wo Contrast  Result Date: 01/22/2018 CLINICAL DATA:  Vertigo beginning 5 days ago. EXAM: MRI HEAD WITHOUT CONTRAST MRA HEAD WITHOUT CONTRAST TECHNIQUE: Multiplanar, multiecho pulse sequences of the brain and surrounding structures were obtained without intravenous contrast. Angiographic images of the head were obtained using MRA technique without contrast. COMPARISON:  CT 01/22/2018.  MRI 07/01/2012. FINDINGS: MRI HEAD FINDINGS Brain: Diffusion imaging does not show any acute or subacute infarction. The brainstem is normal except for wall air in degeneration on the right. No cerebellar abnormality. Left cerebral hemisphere shows mild generalized atrophy with a small focus of cortical gliosis in the medial left frontal lobe. Right hemisphere shows changes of chronic atrophy, encephalomalacia and gliosis throughout, with some relative sparing of the right parietal  region. No sign of mass lesion, hemorrhage, obstructive hydrocephalus or extra-axial collection. Ex vacuo enlargement of the right lateral ventricular system. Vascular: Major vessels at the base of the brain show flow. Skull and upper cervical spine: Previous right craniotomy. No acute bone finding. Sinuses/Orbits: Clear/normal Other: None MRA HEAD FINDINGS Both internal carotid arteries are patent through the skull base and siphon regions. The anterior and middle cerebral vessels are presently patent, though there is diminished flow signal within the right ACA and MCA branches secondary to the decreased amount of brain tissue. Both vertebral arteries are widely patent. Small distal fenestration. The basilar artery is patent and normal. Posterior circulation branch vessels are patent, with diminutive right PCA due to old brain loss in that region. IMPRESSION: No acute finding to explain the clinical presentation. Distant right craniotomy. 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 Left hemisphere shows mild atrophy and a small focus of gliosis in the medial left frontal lobe. Negative intracranial MR angiography, with the exception of diminished flow in the right-sided intracranial branch vessels related to the old right hemispheric brain insult. Electronically Signed   By: Paulina FusiMark  Shogry M.D.   On: 01/22/2018 09:33   Mr Brain Wo Contrast  Result Date: 01/22/2018 CLINICAL DATA:  Vertigo beginning 5 days ago. EXAM: MRI HEAD WITHOUT CONTRAST MRA HEAD WITHOUT CONTRAST TECHNIQUE: Multiplanar, multiecho pulse sequences of the brain and surrounding structures were obtained without intravenous contrast. Angiographic images of the head were obtained using MRA technique without contrast. COMPARISON:  CT 01/22/2018.  MRI 07/01/2012. FINDINGS: MRI HEAD FINDINGS Brain: Diffusion imaging does not show any acute or subacute infarction. The brainstem is  normal except for wall  air in degeneration on the right. No cerebellar abnormality. Left cerebral hemisphere shows mild generalized atrophy with a small focus of cortical gliosis in the medial left frontal lobe. Right hemisphere shows changes of chronic atrophy, encephalomalacia and gliosis throughout, with some relative sparing of the right parietal region. No sign of mass lesion, hemorrhage, obstructive hydrocephalus or extra-axial collection. Ex vacuo enlargement of the right lateral ventricular system. Vascular: Major vessels at the base of the brain show flow. Skull and upper cervical spine: Previous right craniotomy. No acute bone finding. Sinuses/Orbits: Clear/normal Other: None MRA HEAD FINDINGS Both internal carotid arteries are patent through the skull base and siphon regions. The anterior and middle cerebral vessels are presently patent, though there is diminished flow signal within the right ACA and MCA branches secondary to the decreased amount of brain tissue. Both vertebral arteries are widely patent. Small distal fenestration. The basilar artery is patent and normal. Posterior circulation branch vessels are patent, with diminutive right PCA due to old brain loss in that region. IMPRESSION: No acute finding to explain the clinical presentation. Distant right craniotomy. 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 Left hemisphere shows mild atrophy and a small focus of gliosis in the medial left frontal lobe. Negative intracranial MR angiography, with the exception of diminished flow in the right-sided intracranial branch vessels related to the old right hemispheric brain insult. Electronically Signed   By: Paulina Fusi M.D.   On: 01/22/2018 09:33   9:44 AM On reevaluation pt reports headache has improved some now 3/10 and dizziness is less severe. CP has completely resolved and pt had reassuring EKG and 2 normal trops. MRI shows  no acute intracranial cause for patients symptoms. Using [place] interpreter services discussed these results with the patient and family. AT baseline pt uses a walker to ge around, will have pt ambulate in department. Tylenol for remaining headache. He reports meclizine seemed to help with dizziness.   CXR does show possible pneumonia versus aspiration and pt did have vomiting initially with dizziness, vomiting has since resolved and he is tolerating PO in ED, will treat for aspiration pneumonia with Augmentin. Pt has had normal O2 saturations, normal work of breathing with ambulation.  Pt has no PCP, will have pt [facility]. Pt wants to go home today, tolerating PO in department. At this time I feel pt is stable for discharge home with outpatient follow up. Strict return precautions discussed with pt and family, they express understanding and are in agreement with plan.  Pt discussed with Dr. Estell Harpin who is in agreement with plan.   Final diagnoses:  Vertigo  Sudden onset of severe headache  Chest pain, unspecified type  Nausea and vomiting, intractability of vomiting not specified, unspecified vomiting type  Aspiration pneumonia of right lower lobe, unspecified aspiration pneumonia type [facility])     ED Discharge Orders        Ordered    meclizine (ANTIVERT) 25 MG tablet  3 times daily PRN     01/22/18 1048    amoxicillin-clavulanate (AUGMENTIN) 875-125 MG tablet  2 times daily     01/22/18 1048           Jodi Geralds [place], [place] 01/22/18 1056    Bethann Berkshire, MD 01/22/18 1329

## 2018-01-22 NOTE — ED Notes (Signed)
wealked patient to the bathroom patient did well

## 2018-01-22 NOTE — Discharge Instructions (Signed)
Your evaluation today has been reassuring, MRI shows evidence of previous older strokes, but does not show any new acute changes to cause your symptoms.  You may use meclizine 3 times a day as needed to help with dizziness.  If this is getting worse, or you are having severe nausea vomiting please return to the ED for reevaluation.  Your chest x-ray did show possible evidence of a an aspiration pneumonia, this likely happened in the setting of his vomiting, please take Augmentin twice daily for the next week to treat this infection, this antibiotic can cause diarrhea, please make sure you are taking with food, you may also use an over-the-counter probiotic to help with diarrhea.   If you develop persistent fevers, chest pain or difficulty breathing, shortness of breath, severe cough, or your dizziness is getting worse, headache becomes more severe, or you develop difficulty with speech, numbness or weakness in your arms or legs, or facial drooping, please return to the ED for reevaluation.

## 2018-01-22 NOTE — ED Provider Notes (Signed)
[facility]-EMERGENCY DEPT Provider Note   CSN: [id number] Arrival date & time: 01/21/18  2018     History   Chief Complaint Chief Complaint  Patient presents with  . Headache  . Chest Pain  . Dizziness    HPI Miguel Rhodes is a 67 y.o. male who presents the emergency department with multiple complaints.  There is a language barrier and I also suspect some mild dementia or confusion at baseline which limits the patient's history significantly.  Translation is provided by his family member as the patient needed significant instruction during physical examination.  Patient's symptoms began 5 days ago.  He began having intermittent left-sided chest pain lasting about 2 minutes at a time.  He describes the pain as "like being punched in the chest."  He noticed his heart racing during these events.  He denies palpitations.  2 days ago he began having vertiginous symptoms.  Nothing seemed to make the vertigo worse or better and it is not changed with position.  He has never had anything like this before.  To visit today his symptoms became significantly worse he develop epigastric abdominal pain associated with nausea and multiple episodes of nonbloody nonbilious vomitus.  At 7 PM the patient states that he became so ataxic and dizzy he was no longer able to get off of the toilet had to be helped by family members into a wheelchair.  Simultaneously he developed a severe headache which came on suddenly that he rated at 8 out of 10.  He denied changes in vision, unilateral weakness, difficulty with speech or swallowing.  Patient currently rates his headache at about a 5 out of 10 is epigastric pain at a 5 out of 10 and does not currently have chest pain or racing heart.  He does not take any medications.  He does not smoke.  He has a history of alcohol use but has not had any alcohol in the past 6 years.  He denies a history of high cholesterol, hypertension or diabetes.   The patient has a history of an injury to his left leg many years ago that interferes with normal ambulation.  He normally uses a 4 pronged cane for ambulatory assistance.  HPI  Past Medical History:  Diagnosis Date  . Left knee pain    chronic  . Leg injury     There are no active problems to display for this patient.   Past Surgical History:  Procedure Laterality Date  . BRAIN SURGERY         Home Medications    Prior to Admission medications   Medication Sig Start Date End Date Taking? Authorizing Provider  Multiple Vitamins-Minerals (MULTIVITAMIN WITH MINERALS) tablet Take 1 tablet by mouth daily.   Yes [provider]    Family History History reviewed. No pertinent family history.  Social History Social History   Tobacco Use  . Smoking status: Never Smoker  . Smokeless tobacco: Never Used  Substance Use Topics  . Alcohol use: No  . Drug use: No     Allergies   Patient has no known allergies.   Review of Systems Review of Systems Ten systems reviewed and are negative for acute change, except as noted in the HPI.    Physical Exam Updated Vital Signs BP 140/77 (BP Location: Left Arm)   Pulse 63   Temp 97.8 F (36.6 C) (Oral)   Resp 14   Ht 5\' 8"  (1.727 m)   Wt  95.3 kg (210 lb)   SpO2 99%   BMI 31.93 kg/m   Physical Exam  Constitutional: He is oriented to person, place, and time. He appears well-developed and well-nourished. No distress.  HENT:  Head: Normocephalic and atraumatic.  Eyes: Conjunctivae and EOM are normal. Pupils are equal, round, and reactive to light. No scleral icterus.  No nystagmus  Neck: Normal range of motion. Neck supple.  Cardiovascular: Normal rate, regular rhythm and normal heart sounds.  Pulmonary/Chest: Effort normal and breath sounds normal. No respiratory distress.  Abdominal: Soft. There is no tenderness.  Musculoskeletal: He exhibits no edema.  Neurological: He is alert and oriented to person,  place, and time.  Speech is clear and goal oriented, follows commands Major Cranial nerves without deficit, no facial droop Normal strength in upper and lower extremities bilaterally including dorsiflexion and plantar flexion, strong and equal grip strength Sensation normal to light and sharp touch Moves extremities without ataxia, coordination intact Normal finger to nose and rapid alternating movements Neg romberg, no pronator drift Unable to assess gait or heel to shin   Skin: Skin is warm and dry. He is not diaphoretic.  Psychiatric: His behavior is normal.  Nursing note and vitals reviewed.    ED Treatments / Results  Labs (all labs ordered are listed, but only abnormal results are displayed) Labs Reviewed  BASIC METABOLIC PANEL - Abnormal; Notable for the following components:      Result Value   Glucose, Bld 115 (*)    Calcium 8.8 (*)    All other components within normal limits  CBC  I-STAT TROPONIN, ED    EKG  EKG Interpretation None       Radiology Dg Chest 2 View  Result Date: 01/21/2018 CLINICAL DATA:  Chest pain EXAM: CHEST  2 VIEW COMPARISON:  None. FINDINGS: Pain opacities at the right lung base. Cardiomediastinal contours are normal. No pleural effusion or pneumothorax. IMPRESSION: Opacities at the right lung base may indicate developing consolidation, possibly aspiration or pneumonia. Electronically Signed   By: Deatra Robinson M.D.   On: 01/21/2018 22:14    Procedures Procedures (including critical care time)  Medications Ordered in ED Medications - No data to display   Initial Impression / Assessment and Plan / ED Course  I have reviewed the triage vital signs and the nursing notes.  Pertinent labs & imaging results that were available during my care of the patient were reviewed by me and considered in my medical decision making (see chart for details).     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  She states she was very young and does not know much and the son who is present states that he had no idea he had ever had a brain surgery.  Even these facts the patient needs further workup with MRI.  I have spoken with Dr. Wilkie Aye and she will accept him at [facility] for an ED to ED transfer.  I have advised the patient and his family of this and he will go over for further workup.  Final Clinical Impressions(s) / ED Diagnoses   Final diagnoses:  Vertigo  Sudden onset of severe headache  Chest pain, unspecified type  Nausea  and vomiting, intractability of vomiting not specified, unspecified vomiting type    ED Discharge Orders    None       Arthor Captain, PA-C 01/22/18 [id number]    Ward, Layla Maw, DO 01/22/18 [phone]

## 2018-01-22 NOTE — ED Notes (Signed)
Patient transported to MRI 

## 2019-06-28 IMAGING — MR MR MRA HEAD W/O CM
9 of 11 series · 29 of 48 positions shown · non-contrast
Comparison: CT 01/22/2018.  MRI 07/01/2012.

CLINICAL DATA: Vertigo beginning 5 days ago.

EXAM:
MRI HEAD WITHOUT CONTRAST
MRA HEAD WITHOUT CONTRAST
TECHNIQUE: Multiplanar, multiecho pulse sequences of the brain and surrounding
structures were obtained without intravenous contrast. Angiographic
images of the head were obtained using MRA technique without
contrast.

[Series 3: DWI · axial · 3.0mm · 0.94mm/px · z∈[-78,+68]mm · 6 of 99 slices shown (1 of 2)]
[im 1/99]
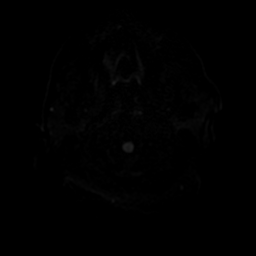
[im 20/99]
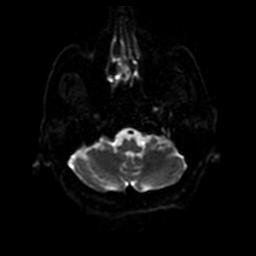
[im 40/99]
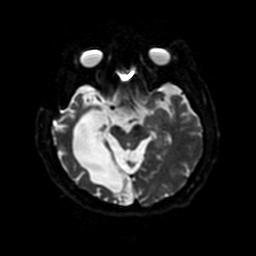
[im 59/99]
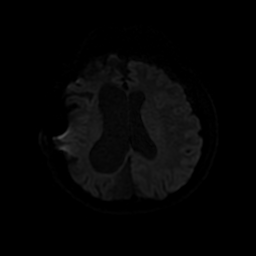
[im 79/99]
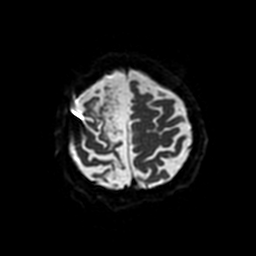
[im 99/99]
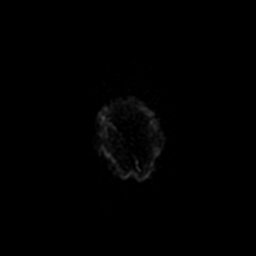

[Series 4: ax (id) 2 · axial · 1.0mm · 0.43mm/px · z∈[-69,-1]mm · 6 of 200 slices shown]
[im 1/200]
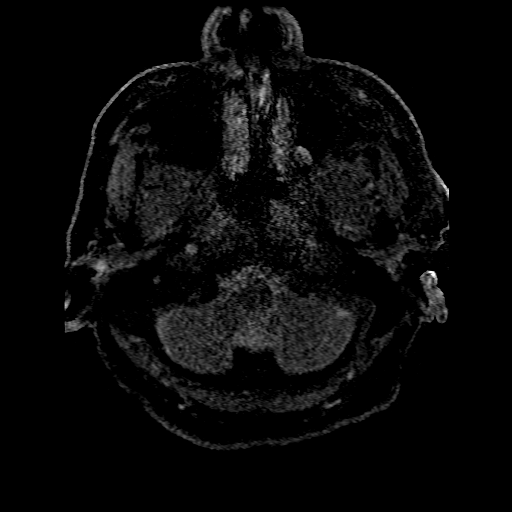
[im 31/200]
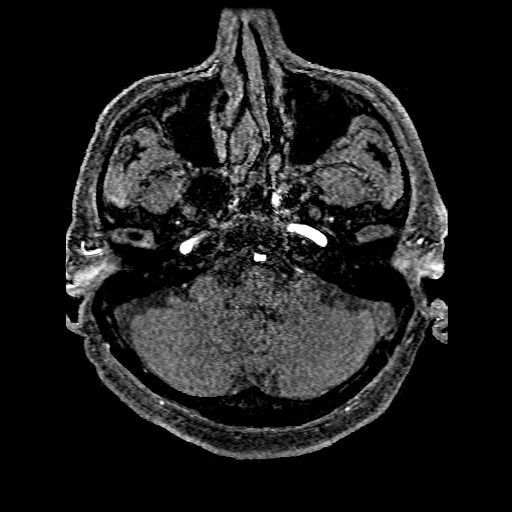
[im 62/200]
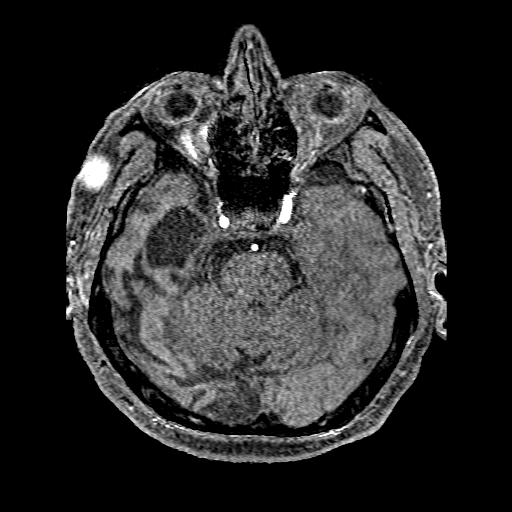
[im 92/200]
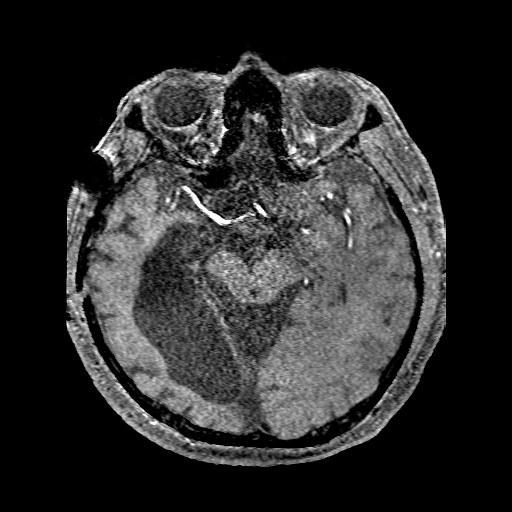
[im 108/200]
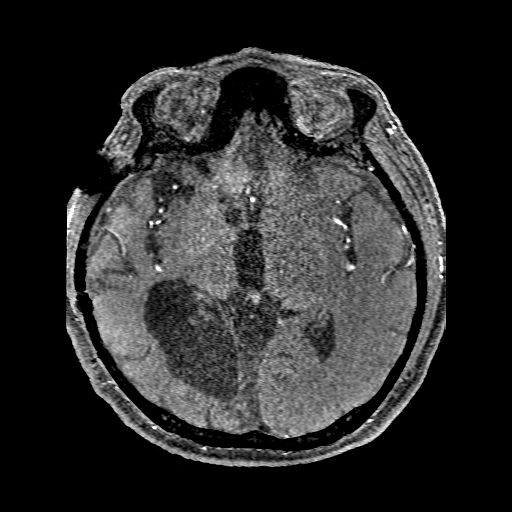
[im 138/200]
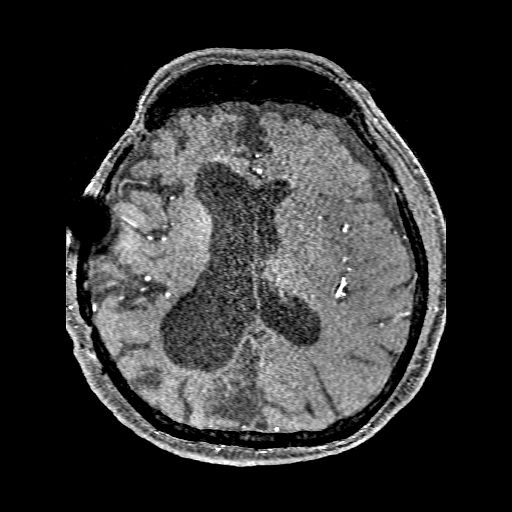

[Series 5: DWI · coronal · 4.0mm · 0.94mm/px · 4 of 62 slices shown (2 of 2)]
[im 1/62]
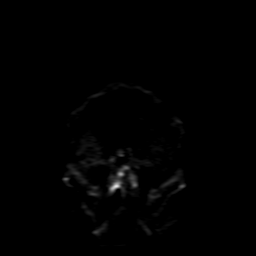
[im 21/62]
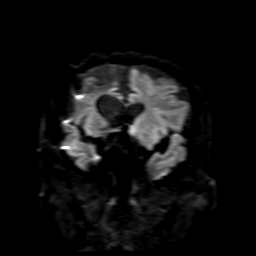
[im 41/62]
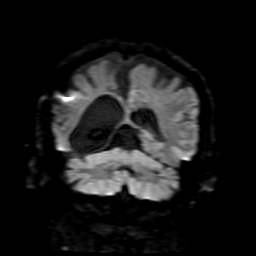
[im 62/62]
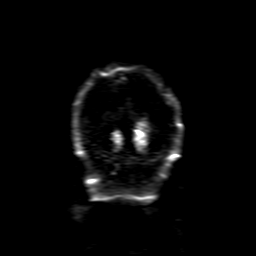

[Series 6: FLAIR · sagittal · 5.0mm · 0.47mm/px · 2 of 25 slices shown (1 of 2)]
[im 1/25]
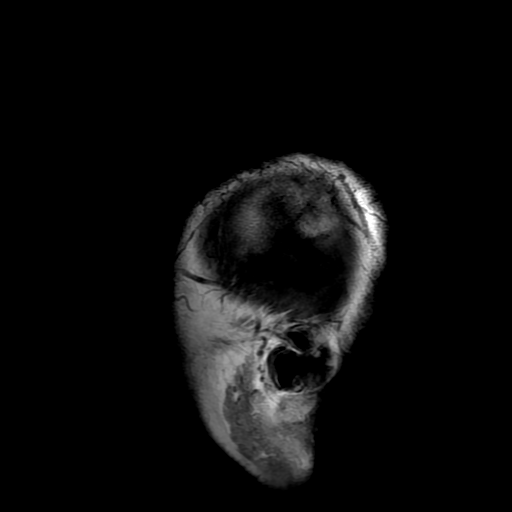
[im 25/25]
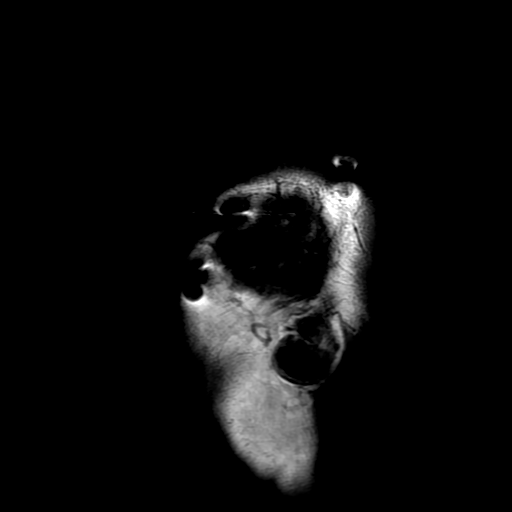

[Series 7: T2 · axial · 5.0mm · 0.47mm/px · z∈[-93,+63]mm · 2 of 27 slices shown (1 of 2)]
[im 1/27]
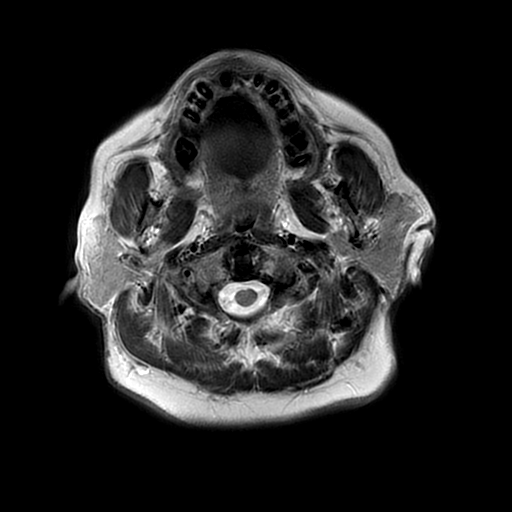
[im 27/27]
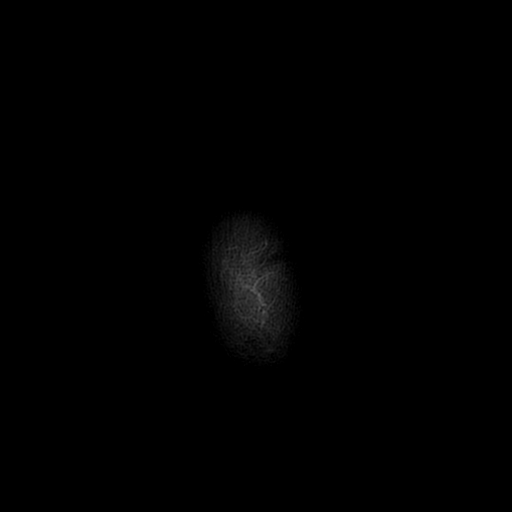

[Series 8: FLAIR · axial · 3.0mm · 0.41mm/px · z∈[-92,+64]mm · 2 of 27 slices shown (2 of 2)]
[im 1/27]
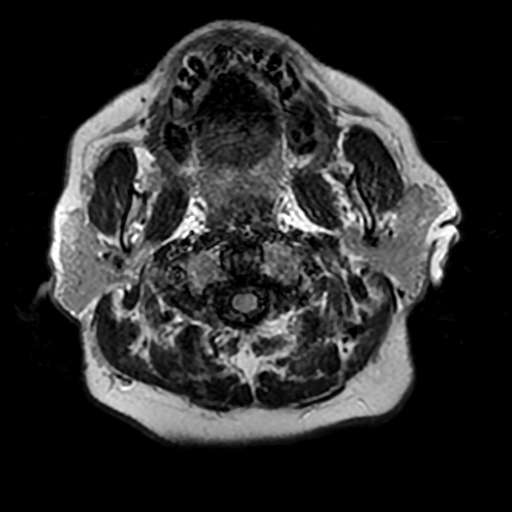
[im 27/27]
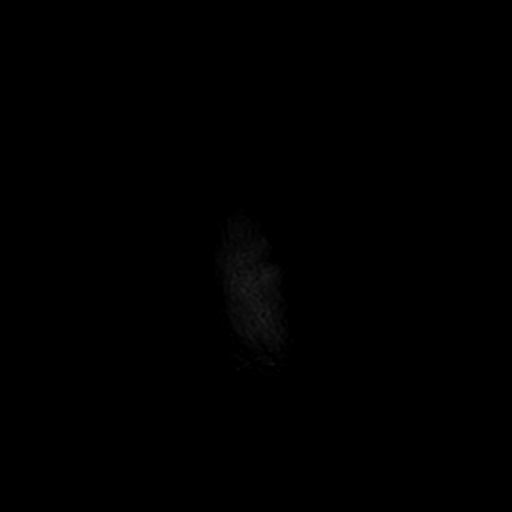

[Series 12: T2 · coronal · 5.0mm · 0.39mm/px · 2 of 27 slices shown (2 of 2)]
[im 1/27]
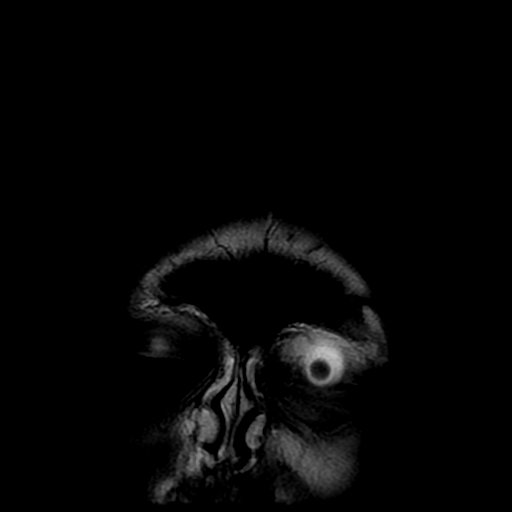
[im 27/27]
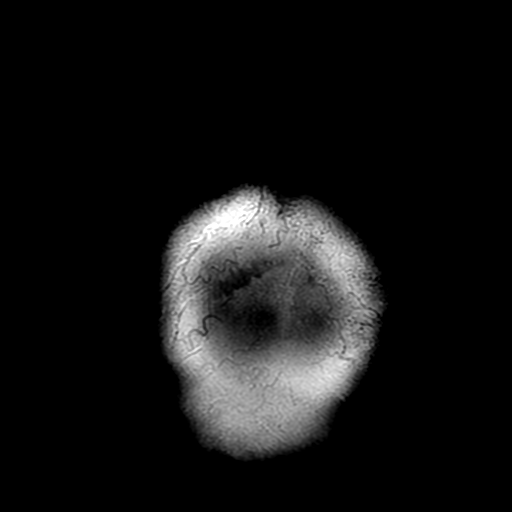

[Series 350: ADC · axial · 3.0mm · 0.94mm/px · z∈[-78,+68]mm · 3 of 50 slices shown (1 of 2)]
[im 1/50]
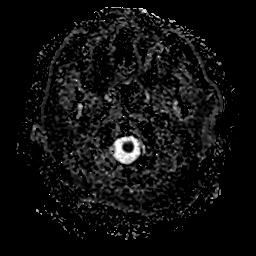
[im 25/50]
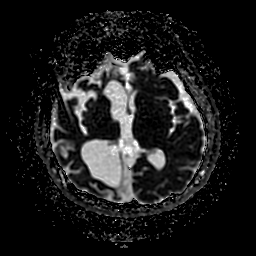
[im 50/50]
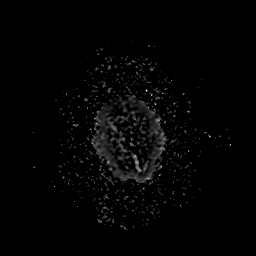

[Series 550: ADC · coronal · 4.0mm · 0.94mm/px · 2 of 31 slices shown (2 of 2)]
[im 1/31]
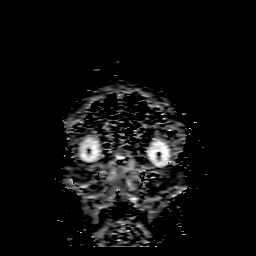
[im 31/31]
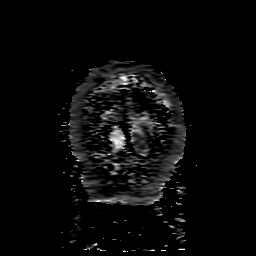

[29 of 48 positions shown; findings below may reference images not displayed]

FINDINGS: MRI HEAD FINDINGS

Brain: Diffusion imaging does not show any acute or subacute
infarction. The brainstem is normal except for wall air in
degeneration on the right. No cerebellar abnormality. Left cerebral
hemisphere shows mild generalized atrophy with a small focus of
cortical gliosis in the medial left frontal lobe. Right hemisphere
shows changes of chronic atrophy, encephalomalacia and gliosis
throughout, with some relative sparing of the right parietal region.
No sign of mass lesion, hemorrhage, obstructive hydrocephalus or
extra-axial collection. Ex vacuo enlargement of the right lateral
ventricular system.

Vascular: Major vessels at the base of the brain show flow.

Skull and upper cervical spine: Previous right craniotomy. No acute
bone finding.

Sinuses/Orbits: Clear/normal

Other: None

MRA HEAD FINDINGS

Both internal carotid arteries are patent through the skull base and
siphon regions. The anterior and middle cerebral vessels are
presently patent, though there is diminished flow signal within the
right ACA and MCA branches secondary to the decreased amount of
brain tissue. Both vertebral arteries are widely patent. Small
distal fenestration. The basilar artery is patent and normal.
Posterior circulation branch vessels are patent, with diminutive
right PCA due to old brain loss in that region.
IMPRESSION: No acute finding to explain the clinical presentation. Distant right
craniotomy. Chronic atrophy, encephalomalacia and gliosis of the
right cerebral hemisphere, unchanged since the previous study. Left
hemisphere shows mild atrophy and a small focus of gliosis in the
medial left frontal lobe.

Negative intracranial MR angiography, with the exception of
diminished flow in the right-sided intracranial branch vessels
related to the old right hemispheric brain insult.

## 2020-06-29 ENCOUNTER — Encounter (HOSPITAL_COMMUNITY): Payer: Self-pay | Admitting: Emergency Medicine

## 2020-06-29 ENCOUNTER — Emergency Department (HOSPITAL_COMMUNITY)
Admission: EM | Admit: 2020-06-29 | Discharge: 2020-06-30 | Disposition: A | Discharge status: Home / Self Care | Payer: Self-pay | Attending: Emergency Medicine | Admitting: Emergency Medicine

## 2020-06-29 DIAGNOSIS — Z5321 Procedure and treatment not carried out due to patient leaving prior to being seen by health care provider: Secondary | ICD-10-CM | POA: Insufficient documentation

## 2020-06-29 DIAGNOSIS — R519 Headache, unspecified: Secondary | ICD-10-CM | POA: Insufficient documentation

## 2020-06-29 DIAGNOSIS — R21 Rash and other nonspecific skin eruption: Secondary | ICD-10-CM | POA: Insufficient documentation

## 2020-06-29 NOTE — ED Notes (Signed)
Pt name called multiple times no answer  

## 2020-06-29 NOTE — ED Triage Notes (Signed)
Daughter Cheron Every ststed, he has a rash, red place on his rt. Forehead for 2 days. He also says he has a headache.

## 2022-05-02 ENCOUNTER — Ambulatory Visit (HOSPITAL_COMMUNITY)
Admission: EM | Admit: 2022-05-02 | Discharge: 2022-05-02 | Disposition: A | Discharge status: Home / Self Care | Payer: Self-pay | Attending: Family Medicine | Admitting: Family Medicine

## 2022-05-02 ENCOUNTER — Encounter (HOSPITAL_COMMUNITY): Payer: Self-pay | Admitting: Emergency Medicine

## 2022-05-02 ENCOUNTER — Other Ambulatory Visit: Payer: Self-pay

## 2022-05-02 DIAGNOSIS — S0990XA Unspecified injury of head, initial encounter: Secondary | ICD-10-CM

## 2022-05-02 DIAGNOSIS — S0101XA Laceration without foreign body of scalp, initial encounter: Secondary | ICD-10-CM

## 2022-05-02 MED ORDER — LIDOCAINE HCL 2 % IJ SOLN
INTRAMUSCULAR | Status: AC
  Filled 2022-05-02: qty 20

## 2022-05-02 NOTE — ED Triage Notes (Signed)
Fell in shower, no loc.  Slight dizziness after fall.  Visible laceration to back of head.  Family member shaved area around laceration on back of head.

## 2022-05-02 NOTE — Discharge Instructions (Addendum)

## 2022-05-04 NOTE — ED Provider Notes (Signed)
[facility]   [id number] 05/02/22 Arrival Time: 1725  ASSESSMENT & PLAN:  1. Acute head injury without loss of consciousness, initial encounter   2. Laceration of scalp, initial encounter     Procedure: Verbal consent obtained. Patient provided with risks and alternatives to the procedure. Wound copiously irrigated with NS then cleansed with betadine. Local anesthesia: Lidocaine 1% with epinephrine. Wound carefully explored. No foreign body, tendon injury, or nonviable tissue were noted. Using clean technique, 2 staples were placed to reapproximate the wound. Procedure tolerated well. No complications. Minimal bleeding. Advised to look for and return for any signs of infection such as redness, swelling, discharge, or worsening pain. Return for staple removal in 5-7 days.    Discharge Instructions      Follow up with your primary care doctor or here within 48-72 hours. Do your best to ensure adequate rest. If not allergic, take acetaminophen (Tylenol) every 4-6 hours as needed for discomfort. Often individuals will develop a headache associated with mild nausea in the days or hours after a head injury. This is called a concussion and may require further follow up.  Please seek prompt medical care if: You have: A very bad (severe) headache that is not helped by medicine. Trouble walking or weakness in your arms and legs. Clear or bloody fluid coming from your nose or ears. Changes in your seeing (vision). Jerky movements that you cannot control (seizure). You throw up (vomit). Your symptoms get worse. You lose balance. Your speech is slurred. You pass out. You are sleepier and have trouble staying awake. The black centers of your eyes (pupils) change in size.  These symptoms may be an emergency. Do not wait to see if the symptoms will go away. Get medical help right away. Call your local emergency services. Do not drive yourself to the hospital.    Reviewed  expectations re: course of current medical issues. Questions answered. Outlined signs and symptoms indicating need for more acute intervention. Patient verbalized understanding. After Visit Summary given.   SUBJECTIVE:  Miguel Rhodes is a 71 y.o. male who presents with a laceration of his occipital scalp. Fell in shower, no loc. Slight dizziness after fall that has resolved. Visible laceration to back of head. Bleeding controlled. Family member shaved area around laceration on back of head.   Ambulatory here. No extremity sensation changes or weakness. No HA or visual changes. Feels Td is UTD.  OBJECTIVE:  Vitals:   05/02/22 1757  BP: 104/70  Pulse: 60  Resp: 20  Temp: 98.2 F (36.8 C)  TempSrc: Oral  SpO2: 97%    Family interpreting; decline interpreter. General appearance: alert; no distress HEENT: approx 1 cm linear laceration of occipital scalp; no active bleeding; no FB appreciated; PERRLA; EOMI Neck: FROM without midline TTP Neuro: CN 2-12 grossly intact; moving extremities normally; normal speech Psychological: alert and cooperative; normal mood and affect   No Known Allergies  Past Medical History:  Diagnosis Date   Left knee pain    chronic   Leg injury    Social History   Socioeconomic History   Marital status: Single    Spouse name: Not on file   Number of children: Not on file   Years of education: Not on file   Highest education level: Not on file  Occupational History   Not on file  Tobacco Use   Smoking status: Never   Smokeless tobacco: Never  Vaping Use   Vaping Use: Never used  Substance and Sexual Activity   Alcohol use: No   Drug use: No   Sexual activity: Not on file  Other Topics Concern   Not on file  Social History Narrative   Not on file   Social Determinants of Health   Financial Resource Strain: Not on file  Food Insecurity: Not on file  Transportation Needs: Not on file  Physical Activity: Not on file   Stress: Not on file  Social Connections: Not on file          Mardella Layman, MD 05/04/22 [phone]

## 2022-05-06 ENCOUNTER — Encounter (HOSPITAL_BASED_OUTPATIENT_CLINIC_OR_DEPARTMENT_OTHER): Payer: Self-pay

## 2022-05-06 ENCOUNTER — Emergency Department (HOSPITAL_BASED_OUTPATIENT_CLINIC_OR_DEPARTMENT_OTHER)
Admission: EM | Admit: 2022-05-06 | Discharge: 2022-05-06 | Disposition: A | Discharge status: Home / Self Care | Payer: Self-pay | Attending: Emergency Medicine | Admitting: Emergency Medicine

## 2022-05-06 ENCOUNTER — Other Ambulatory Visit: Payer: Self-pay

## 2022-05-06 DIAGNOSIS — I83893 Varicose veins of bilateral lower extremities with other complications: Secondary | ICD-10-CM | POA: Insufficient documentation

## 2022-05-06 DIAGNOSIS — I83899 Varicose veins of unspecified lower extremities with other complications: Secondary | ICD-10-CM

## 2022-05-06 NOTE — ED Triage Notes (Signed)
Pt arrives from home via [facility] -- s/p L foot bleed after picking at a scab vs varicose vein -- site began bleeding; family unable to control bleed and contacted ems -- per family bleeding approx prior to ems arrival -- EMS on scene applied pressure dressing which has since controlled bleeding -- LLE pedal pulses palpable -- pt able to move toes but not very well which is baseline for him; h/o LLE mobility issues per family.   Dressing removed upon arrival to ED

## 2022-05-06 NOTE — ED Provider Notes (Signed)
[facility] EMERGENCY DEPT Provider Note   CSN: [id number] Arrival date & time: 05/06/22  1943     History  Chief Complaint  Patient presents with   Foot Injury    Miguel Rhodes is a 71 y.o. male.  Patient brought in by EMS.  Patient known to have some varicose veins of both lower extremities but left ankle area greater than right.  Patient was picking at a scab at a varicose vein and this started bleeding.  The had difficulty controlling the bleeding.  EMS wrapped it EMS felt that bleeding occurred about 40 minutes prior.  Patient was recently seen in urgent care for a fall hitting the back of his head and has 2 staples in that area.  And that looks like that was June 3.  Patient not on any blood thinners.  Patient's primary care doctors in the [place] area.  Patient did not pass out.  Patient's heart rate here 63 blood pressure 115/65.  Temp 98.3.  Dressing removed here and bleeding was controlled.  Patient is Hispanic.  And daughter able to translate without any difficulty.  Patient not on any blood thinners.      Home Medications Prior to Admission medications   Medication Sig Start Date End Date Taking? Authorizing Provider  amoxicillin-clavulanate (AUGMENTIN) 875-125 MG tablet Take 1 tablet by mouth 2 (two) times daily. Patient not taking: Reported on 05/02/2022 01/22/18   Dartha Lodge, PA-C  finasteride (PROSCAR) 5 MG tablet Take 5 mg by mouth daily. 12/11/21   [provider]  meclizine (ANTIVERT) 25 MG tablet Take 1 tablet (25 mg total) by mouth 3 (three) times daily as needed for dizziness. 01/22/18   Dartha Lodge, PA-C  Multiple Vitamins-Minerals (MULTIVITAMIN WITH MINERALS) tablet Take 1 tablet by mouth daily.    [provider]  tamsulosin (FLOMAX) 0.4 MG CAPS capsule Take by mouth.    [provider]      Allergies    Patient has no known allergies.    Review of Systems   Review of Systems  Constitutional:   Negative for chills and fever.  HENT:  Negative for ear pain and sore throat.   Eyes:  Negative for pain and visual disturbance.  Respiratory:  Negative for cough and shortness of breath.   Cardiovascular:  Negative for chest pain and palpitations.  Gastrointestinal:  Negative for abdominal pain and vomiting.  Genitourinary:  Negative for dysuria and hematuria.  Musculoskeletal:  Negative for arthralgias and back pain.  Skin:  Negative for color change and rash.  Neurological:  Negative for seizures and syncope.  Hematological:  Does not bruise/bleed easily.  All other systems reviewed and are negative.  Physical Exam Updated Vital Signs BP 115/65 (BP Location: Right Arm)   Pulse 63   Temp 98.3 F (36.8 C) (Oral)   Resp 20   Ht 1.549 m (5\' 1" )   Wt 79.4 kg   SpO2 95%   BMI 33.07 kg/m  Physical Exam Vitals and nursing note reviewed.  Constitutional:      General: He is not in acute distress.    Appearance: Normal appearance. He is well-developed. He is not ill-appearing.  HENT:     Head: Normocephalic.     Comments: Posterior right occipital area well-healing scalp wound with 2 surgical staples in place Eyes:     Extraocular Movements: Extraocular movements intact.     Conjunctiva/sclera: Conjunctivae normal.     Pupils: Pupils are equal, round, and  reactive to light.  Cardiovascular:     Rate and Rhythm: Normal rate and regular rhythm.     Heart sounds: No murmur heard. Pulmonary:     Effort: Pulmonary effort is normal. No respiratory distress.     Breath sounds: Normal breath sounds.  Abdominal:     Palpations: Abdomen is soft.     Tenderness: There is no abdominal tenderness.  Musculoskeletal:        General: No swelling.     Cervical back: Neck supple.     Comments: Left foot medial ankle area a small punctate opening in a varicose vein bleeding controlled.  Marked varicose veins throughout the ankle area.  No active bleeding.  Dorsalis pedis pulse to the foot is  1+.  Good cap refill.  Good movement of toes and ankle.  No significant proximal varicose veins noted.  Skin:    General: Skin is warm and dry.     Capillary Refill: Capillary refill takes less than 2 seconds.  Neurological:     General: No focal deficit present.     Mental Status: He is alert and oriented to person, place, and time.  Psychiatric:        Mood and Affect: Mood normal.    ED Results / Procedures / Treatments   Labs (all labs ordered are listed, but only abnormal results are displayed) Labs Reviewed - No data to display  EKG None  Radiology No results found.  Procedures Procedures    Medications Ordered in ED Medications - No data to display  ED Course/ Medical Decision Making/ A&P                           Medical Decision Making  Patient with marked varicose veins prickly left ankle area.  No active bleeding.  Will dressed with a nonadherent dressings and bacitracin ointment some Curlex and Ace wrap.  We will have them keep that in place for the next 2 days.  Elevate the leg is much as possible.  Recommend following back up with his primary care doctor in [place].  May want to be referred to vein and vascular clinic for further evaluation.  Vital signs stable.  No concerns for significant blood loss.  Patient daughter states he does not have any pre-existing anemia.   Final Clinical Impression(s) / ED Diagnoses Final diagnoses:  Bleeding from varicose vein    Rx / DC Orders ED Discharge Orders     None         Vanetta Mulders, MD 05/06/22 2104

## 2022-05-06 NOTE — Discharge Instructions (Signed)
Recommend follow-up with primary care doctor in the [place] area.  Would consider possible referral to vein clinic.  Keep the dressing in place for the next 2 days.  Keep leg elevated is much as possible.  Return for any new or worse symptoms.  After 2 days the dressing can be removed.

## 2022-05-06 NOTE — ED Notes (Signed)
Pt daughter/caregiver at bedside -- pt and family agreeable with d/c plan as discussed by provider - this nurse has verbally reinforced d/c instructions with pt and daughter and provided written copy - pt/caregiver acknowledge verbal understanding and deny any additional questions, concerns, needs- pt escorted to bathroom via w/c with assistance of this nurse and  pt daughter then escorted to exit via w/c
